# Patient Record
Sex: Female | Born: 1995 | Race: Black or African American | Hispanic: No | Marital: Single | Smoking: Current every day smoker
Health system: Southern US, Community
[De-identification: ages and names within clinical notes are randomized; demographics above are authoritative.]

## PROBLEM LIST (undated history)

## (undated) DIAGNOSIS — D509 Iron deficiency anemia, unspecified: Secondary | ICD-10-CM

---

## 2016-02-23 ENCOUNTER — Encounter: Payer: Self-pay | Admitting: Emergency Medicine

## 2016-02-23 ENCOUNTER — Emergency Department
Admission: EM | Admit: 2016-02-23 | Discharge: 2016-02-23 | Disposition: A | Discharge status: Home / Self Care | Payer: Self-pay | Attending: Emergency Medicine | Admitting: Emergency Medicine

## 2016-02-23 DIAGNOSIS — R55 Syncope and collapse: Secondary | ICD-10-CM | POA: Insufficient documentation

## 2016-02-23 LAB — BASIC METABOLIC PANEL
ANION GAP: 4 — AB (ref 5–15)
BUN: 12 mg/dL (ref 6–20)
CALCIUM: 9.3 mg/dL (ref 8.9–10.3)
CO2: 25 mmol/L (ref 22–32)
Chloride: 108 mmol/L (ref 101–111)
Creatinine, Ser: 0.58 mg/dL (ref 0.44–1.00)
Glucose, Bld: 103 mg/dL — ABNORMAL HIGH (ref 65–99)
Potassium: 4.3 mmol/L (ref 3.5–5.1)
Sodium: 137 mmol/L (ref 135–145)

## 2016-02-23 LAB — URINALYSIS COMPLETE WITH MICROSCOPIC (ARMC ONLY)
Bacteria, UA: NONE SEEN
Bilirubin Urine: NEGATIVE
Glucose, UA: NEGATIVE mg/dL
Hgb urine dipstick: NEGATIVE
Leukocytes, UA: NEGATIVE
NITRITE: NEGATIVE
PH: 5 (ref 5.0–8.0)
PROTEIN: 100 mg/dL — AB
SPECIFIC GRAVITY, URINE: 1.026 (ref 1.005–1.030)

## 2016-02-23 LAB — CBC
HCT: 30.4 % — ABNORMAL LOW (ref 35.0–47.0)
HEMOGLOBIN: 9.3 g/dL — AB (ref 12.0–16.0)
MCH: 19.5 pg — ABNORMAL LOW (ref 26.0–34.0)
MCHC: 30.7 g/dL — ABNORMAL LOW (ref 32.0–36.0)
MCV: 63.5 fL — ABNORMAL LOW (ref 80.0–100.0)
Platelets: 293 10*3/uL (ref 150–440)
RBC: 4.78 MIL/uL (ref 3.80–5.20)
RDW: 21.5 % — ABNORMAL HIGH (ref 11.5–14.5)
WBC: 4.6 10*3/uL (ref 3.6–11.0)

## 2016-02-23 LAB — POCT PREGNANCY, URINE: Preg Test, Ur: NEGATIVE

## 2016-02-23 MED ORDER — FERROUS FUMARATE 325 (106 FE) MG PO TABS: 1.0000 | ORAL_TABLET | Freq: Every day | ORAL | 2 refills | Status: AC

## 2016-02-23 NOTE — ED Provider Notes (Signed)
[facility] Emergency Department Provider Note    ____________________________________________   I have reviewed the triage vital signs and the nursing notes.   HISTORY  Chief Complaint Near Syncope   History limited by: Not Limited   HPI Linda Stokes is a 20 y.o. female who presents to the emergency department today because of concerns for a syncopal episode. Patient states she was at work. She was standing up when she started to feel sick and lightheaded. She then passed out. She denies any injuries from falling down. She denies any chest pain during this episode. No history of CVA in the past. States that she had a fairly normal morning. Patient had been diagnosed with anemia once in the past and had been prescribed iron pills in the past however is currently not taking any. Patient denies any recent fevers or illness.     History reviewed. No pertinent past medical history.  There are no active problems to display for this patient.   History reviewed. No pertinent surgical history.  Prior to Admission medications   Not on File    Allergies Review of patient's allergies indicates no known allergies.  No family history on file.  Social History Social History  Substance Use Topics  . Smoking status: Never Smoker  . Smokeless tobacco: Never Used  . Alcohol use No    Review of Systems  Constitutional: Negative for fever. Cardiovascular: Negative for chest pain. Respiratory: Negative for shortness of breath. Gastrointestinal: Negative for abdominal pain, vomiting and diarrhea. Neurological: Negative for headaches, focal weakness or numbness.   10-point ROS otherwise negative.  ____________________________________________   PHYSICAL EXAM:  VITAL SIGNS: ED Triage Vitals   Enc Vitals Group     BP 105/69     Pulse Rate 81     Resp 16     Temp 97.5 F (36.4 C)     Temp Source Oral     SpO2 100 %     Weight 123 lb (55.8 kg)      Height 5\' 1"  (1.549 m)     Head Circumference      Peak Flow      Pain Score 8   Constitutional: Alert and oriented. Well appearing and in no distress. Eyes: Conjunctivae are normal. PERRL. Normal extraocular movements. ENT   Head: Normocephalic and atraumatic.   Nose: No congestion/rhinnorhea.   Mouth/Throat: Mucous membranes are moist.   Neck: No stridor. Hematological/Lymphatic/Immunilogical: No cervical lymphadenopathy. Cardiovascular: Normal rate, regular rhythm.  No murmurs, rubs, or gallops. Respiratory: Normal respiratory effort without tachypnea nor retractions. Breath sounds are clear and equal bilaterally. No wheezes/rales/rhonchi. Gastrointestinal: Soft and nontender. No distention. There is no CVA tenderness. Genitourinary: Deferred Musculoskeletal: Normal range of motion in all extremities. No joint effusions.  No lower extremity tenderness nor edema. Neurologic:  Normal speech and language. No gross focal neurologic deficits are appreciated.  Skin:  Skin is warm, dry and intact. No rash noted. Psychiatric: Mood and affect are normal. Speech and behavior are normal. Patient exhibits appropriate insight and judgment.  ____________________________________________    LABS (pertinent positives/negatives)  Labs Reviewed  BASIC METABOLIC PANEL - Abnormal; Notable for the following:       Result Value   Glucose, Bld 103 (*)    Anion gap 4 (*)    All other components within normal limits  CBC - Abnormal; Notable for the following:    Hemoglobin 9.3 (*)    HCT 30.4 (*)    MCV 63.5 (*)  MCH 19.5 (*)    MCHC 30.7 (*)    RDW 21.5 (*)    All other components within normal limits  URINALYSIS COMPLETEWITH MICROSCOPIC ([facility] ONLY) - Abnormal; Notable for the following:    Color, Urine YELLOW (*)    APPearance HAZY (*)    Ketones, ur TRACE (*)    Protein, ur 100 (*)    Squamous Epithelial / LPF 6-30 (*)    All other components within normal limits  POC  URINE PREG, ED  POCT PREGNANCY, URINE  CBG MONITORING, ED     ____________________________________________   EKG  I, Phineas SemenGraydon Desaree Downen, attending physician, personally viewed and interpreted this EKG  EKG Time: 1514 Rate: 89 Rhythm: normal sinus rhythm Axis: normal Intervals: qtc 423 QRS: narrow ST changes: no st elevation Impression: normal ekg  ____________________________________________    RADIOLOGY  None  ____________________________________________   PROCEDURES  Procedures  ____________________________________________   INITIAL IMPRESSION / ASSESSMENT AND PLAN / ED COURSE  Pertinent labs & imaging results that were available during my care of the patient were reviewed by me and considered in my medical decision making (see chart for details).  Patient presents to the emergency department today after a episode of syncope at work. Appears safe for some anemia without any concerning findings. Patient is a history of anemia. Will plan on discharging patient home with prescription for iron pills. Patient will follow-up with primary care. ____________________________________________   FINAL CLINICAL IMPRESSION(S) / ED DIAGNOSES  Final diagnoses:  Syncope and collapse     Note: This dictation was prepared with Dragon dictation. Any transcriptional errors that result from this process are unintentional    Phineas SemenGraydon Yer Olivencia, MD 02/23/16 [id number]

## 2016-02-23 NOTE — Discharge Instructions (Signed)
Please seek medical attention for any high fevers, chest pain, shortness of breath, change in behavior, persistent vomiting, bloody stool or any other new or concerning symptoms.  

## 2016-02-23 NOTE — ED Triage Notes (Signed)
While at work patient felt nauseated and had asked line leader if she could go to the bathroom.  Patient describes everything just went black.  Per EMS patient had a near syncopal episode at work and was assisted to floor by Radio broadcast assistantcoworker.  CBG:  105

## 2017-10-30 ENCOUNTER — Encounter: Payer: Self-pay | Admitting: Emergency Medicine

## 2017-10-30 ENCOUNTER — Emergency Department
Admission: EM | Admit: 2017-10-30 | Discharge: 2017-10-30 | Disposition: A | Discharge status: Home / Self Care | Payer: Self-pay | Attending: Emergency Medicine | Admitting: Emergency Medicine

## 2017-10-30 ENCOUNTER — Other Ambulatory Visit: Payer: Self-pay

## 2017-10-30 DIAGNOSIS — R509 Fever, unspecified: Secondary | ICD-10-CM | POA: Insufficient documentation

## 2017-10-30 DIAGNOSIS — J0110 Acute frontal sinusitis, unspecified: Secondary | ICD-10-CM | POA: Insufficient documentation

## 2017-10-30 DIAGNOSIS — R0981 Nasal congestion: Secondary | ICD-10-CM | POA: Insufficient documentation

## 2017-10-30 MED ORDER — FLUTICASONE PROPIONATE 50 MCG/ACT NA SUSP: 1.0000 | Freq: Two times a day (BID) | NASAL | 0 refills | Status: AC

## 2017-10-30 MED ORDER — AMOXICILLIN-POT CLAVULANATE 875-125 MG PO TABS: 1.0000 | ORAL_TABLET | Freq: Two times a day (BID) | ORAL | 0 refills | Status: AC

## 2017-10-30 NOTE — ED Notes (Signed)
See triage note  Presents with sinus pressure and frontal headache  States she had subjective fever yesterday  Afebrile on arrival  States she took a zyrtec yesterday  And sx's eased off

## 2017-10-30 NOTE — ED Provider Notes (Signed)
[facility] Emergency Department Provider Note  ____________________________________________  Time seen: Approximately 3:10 PM  I have reviewed the triage vital signs and the nursing notes.   HISTORY  Chief Complaint Generalized Body Aches and Nasal Congestion    HPI Linda Stokes is a 22 y.o. female who presents emergency department complaining of sinus pressure, frontal headache, nasal congestion, subjective fever.  Patient reports that most of the sinus pressure is in the left frontal region.  Patient reports that she has had increased sinus congestion over the past week but symptoms have worsened over the past 2 days.  Patient took Zyrtec yesterday which did improve symptoms but they returned this morning.  Patient denies any difficulty breathing or swallowing, chest pain, shortness of breath, cough, abdominal pain, nausea or vomiting.  Other than Zyrtec, no medications for this complaint prior to arrival.  No other complaints at this time.  History reviewed. No pertinent past medical history.  There are no active problems to display for this patient.   History reviewed. No pertinent surgical history.  Prior to Admission medications   Medication Sig Start Date End Date Taking? Authorizing Provider  amoxicillin-clavulanate (AUGMENTIN) 875-125 MG tablet Take 1 tablet by mouth 2 (two) times daily. 10/30/17   Cuthriell, Delorise Royals, PA-C  ferrous fumarate (FERRETTS) 325 (106 Fe) MG TABS tablet Take 1 tablet (106 mg of iron total) by mouth daily. 02/23/16   Phineas Semen, MD  fluticasone (FLONASE) 50 MCG/ACT nasal spray Place 1 spray into both nostrils 2 (two) times daily. 10/30/17   Cuthriell, Delorise Royals, PA-C    Allergies Patient has no known allergies.  History reviewed. No pertinent family history.  Social History Social History   Tobacco Use  . Smoking status: Never Smoker  . Smokeless tobacco: Never Used  Substance Use Topics  . Alcohol use: No   . Drug use: No     Review of Systems  Constitutional: Subjective tactile fever/chills Eyes: No visual changes. No discharge ENT: Positive for nasal congestion, sinus pressure Cardiovascular: no chest pain. Respiratory: no cough. No SOB. Gastrointestinal: No abdominal pain.  No nausea, no vomiting.  No diarrhea.  No constipation. Musculoskeletal: Negative for musculoskeletal pain. Skin: Negative for rash, abrasions, lacerations, ecchymosis. Neurological: Positive for frontal headache but denies focal weakness or numbness. 10-point ROS otherwise negative.  ____________________________________________   PHYSICAL EXAM:  VITAL SIGNS: ED Triage Vitals  Enc Vitals Group     BP 10/30/17 1300 111/78     Pulse Rate 10/30/17 1300 99     Resp 10/30/17 1300 18     Temp 10/30/17 1300 98.9 F (37.2 C)     Temp Source 10/30/17 1300 Oral     SpO2 10/30/17 1300 94 %     Weight 10/30/17 1300 126 lb (57.2 kg)     Height 10/30/17 1300  (1.549 m)     Head Circumference --      Peak Flow --      Pain Score 10/30/17 1259 8     Pain Loc --      Pain Edu? --      Excl. in GC? --      Constitutional: Alert and oriented. Well appearing and in no acute distress. Eyes: Conjunctivae are normal. PERRL. EOMI. Head: Atraumatic. ENT:      Ears: EACs and TMs unremarkable bilaterally.      Nose: Mild to moderate clear congestion/rhinnorhea.  She is tender to percussion over the frontal and maxillary sinus, greater  left than right.      Mouth/Throat: Mucous membranes are moist.  Oropharynx is nonerythematous and nonedematous.  Uvula is midline. Neck: No stridor.  Neck is supple full range of motion Hematological/Lymphatic/Immunilogical: No cervical lymphadenopathy. Cardiovascular: Normal rate, regular rhythm. Normal S1 and S2.  Good peripheral circulation. Respiratory: Normal respiratory effort without tachypnea or retractions. Lungs CTAB. Good air entry to the bases with no decreased or absent  breath sounds. Musculoskeletal: Full range of motion to all extremities. No gross deformities appreciated. Neurologic:  Normal speech and language. No gross focal neurologic deficits are appreciated.  Skin:  Skin is warm, dry and intact. No rash noted. Psychiatric: Mood and affect are normal. Speech and behavior are normal. Patient exhibits appropriate insight and judgement.   ____________________________________________   LABS (all labs ordered are listed, but only abnormal results are displayed)  Labs Reviewed - No data to display ____________________________________________  EKG   ____________________________________________  RADIOLOGY   No results found.  ____________________________________________    PROCEDURES  Procedure(s) performed:    Procedures    Medications - No data to display   ____________________________________________   INITIAL IMPRESSION / ASSESSMENT AND PLAN / ED COURSE  Pertinent labs & imaging results that were available during my care of the patient were reviewed by me and considered in my medical decision making (see chart for details).  Review of the  [place] was performed in accordance of the [facility] prior to dispensing any controlled drugs.     Patient's diagnosis is consistent with sinusitis.  Patient presents with worsening sinusitis with sinus pressure and headache.  Exam is consistent with sinusitis.  Differential included viral URI, allergic rhinitis, sinusitis.. Patient will be discharged home with prescriptions for Flonase, Augmentin.  Patient is to use Zyrtec at home.. Patient is to follow up with primary care as needed or otherwise directed. Patient is given ED precautions to return to the ED for any worsening or new symptoms.     ____________________________________________  FINAL CLINICAL IMPRESSION(S) / ED DIAGNOSES  Final diagnoses:  Acute non-recurrent frontal sinusitis      NEW MEDICATIONS STARTED DURING THIS  VISIT:  ED Discharge Orders        Ordered    amoxicillin-clavulanate (AUGMENTIN) 875-125 MG tablet  2 times daily     10/30/17 1517    fluticasone (FLONASE) 50 MCG/ACT nasal spray  2 times daily     10/30/17 1517          This chart was dictated using voice recognition software/Dragon. Despite best efforts to proofread, errors can occur which can change the meaning. Any change was purely unintentional.    Racheal Patches, PA-C 10/30/17 1517    Jeanmarie Plant, MD 10/30/17 2252

## 2017-10-30 NOTE — ED Triage Notes (Signed)
Pt reports subjective fever, body aches and pain with swallowing, worse  Yesterday , but better today. Pt states she took Zyrtec yesterday.  Pt c/o nasal congestion.

## 2018-05-29 ENCOUNTER — Encounter: Payer: Self-pay | Admitting: Emergency Medicine

## 2018-05-29 ENCOUNTER — Emergency Department
Admission: EM | Admit: 2018-05-29 | Discharge: 2018-05-29 | Disposition: A | Discharge status: Home / Self Care | Payer: Self-pay | Attending: Emergency Medicine | Admitting: Emergency Medicine

## 2018-05-29 DIAGNOSIS — K529 Noninfective gastroenteritis and colitis, unspecified: Secondary | ICD-10-CM | POA: Insufficient documentation

## 2018-05-29 DIAGNOSIS — Z79899 Other long term (current) drug therapy: Secondary | ICD-10-CM | POA: Insufficient documentation

## 2018-05-29 LAB — URINALYSIS, COMPLETE (UACMP) WITH MICROSCOPIC
Bacteria, UA: NONE SEEN
Bilirubin Urine: NEGATIVE
Glucose, UA: NEGATIVE mg/dL
Ketones, ur: NEGATIVE mg/dL
Leukocytes, UA: NEGATIVE
Nitrite: NEGATIVE
Protein, ur: 30 mg/dL — AB
Specific Gravity, Urine: 1.026 (ref 1.005–1.030)
pH: 5 (ref 5.0–8.0)

## 2018-05-29 LAB — COMPREHENSIVE METABOLIC PANEL WITH GFR
ALT: 8 U/L (ref 0–44)
AST: 14 U/L — ABNORMAL LOW (ref 15–41)
Albumin: 3.8 g/dL (ref 3.5–5.0)
Alkaline Phosphatase: 50 U/L (ref 38–126)
Anion gap: 10 (ref 5–15)
BUN: 9 mg/dL (ref 6–20)
CO2: 24 mmol/L (ref 22–32)
Calcium: 9.3 mg/dL (ref 8.9–10.3)
Chloride: 104 mmol/L (ref 98–111)
Creatinine, Ser: 0.64 mg/dL (ref 0.44–1.00)
GFR calc Af Amer: >60 mL/min
GFR calc non Af Amer: >60 mL/min
Glucose, Bld: 114 mg/dL — ABNORMAL HIGH (ref 70–99)
Potassium: 2.9 mmol/L — ABNORMAL LOW (ref 3.5–5.1)
Sodium: 138 mmol/L (ref 135–145)
Total Bilirubin: 0.4 mg/dL (ref 0.3–1.2)
Total Protein: 7.2 g/dL (ref 6.5–8.1)

## 2018-05-29 LAB — CBC
HCT: 31 % — ABNORMAL LOW (ref 36.0–46.0)
HEMOGLOBIN: 8.8 g/dL — AB (ref 12.0–15.0)
MCH: 18.5 pg — AB (ref 26.0–34.0)
MCHC: 28.4 g/dL — AB (ref 30.0–36.0)
MCV: 65.3 fL — AB (ref 80.0–100.0)
Platelets: 460 10*3/uL — ABNORMAL HIGH (ref 150–400)
RBC: 4.75 MIL/uL (ref 3.87–5.11)
RDW: 22.7 % — ABNORMAL HIGH (ref 11.5–15.5)
WBC: 4.4 10*3/uL (ref 4.0–10.5)
nRBC: 0 % (ref 0.0–0.2)

## 2018-05-29 LAB — POCT PREGNANCY, URINE: PREG TEST UR: NEGATIVE

## 2018-05-29 LAB — LIPASE, BLOOD: Lipase: 29 U/L (ref 11–51)

## 2018-05-29 MED ORDER — ONDANSETRON 4 MG PO TBDP: 4.0000 mg | ORAL_TABLET | Freq: Three times a day (TID) | ORAL | 0 refills | Status: DC | PRN

## 2018-05-29 MED ORDER — ONDANSETRON 4 MG PO TBDP
4.0000 mg | ORAL_TABLET | Freq: Once | ORAL | Status: AC
  Administered 2018-05-29: 4 mg via ORAL
  Filled 2018-05-29: qty 1

## 2018-05-29 NOTE — ED Provider Notes (Signed)
[facility] Emergency Department Provider Note   ____________________________________________    I have reviewed the triage vital signs and the nursing notes.   HISTORY  Chief Complaint Abdominal Pain     HPI Linda Stokes is a 22 y.o. female who presents with complaints of abdominal cramping, nausea vomiting and diarrhea over the last several days.  Patient reports she has been on her menstrual cycle and has been taking ibuprofen for pelvic cramps which is typical for her.  She also reports that she then developed upper and lower abdominal cramping with nausea vomiting and diarrhea.  She has tried Pepto-Bismol with little improvement.  However today she does report that she feels somewhat better.  She is concerned because she was taking ibuprofen for the pain and she worries this may have injured her stomach lining.   History reviewed. No pertinent past medical history.  There are no active problems to display for this patient.   History reviewed. No pertinent surgical history.  Prior to Admission medications   Medication Sig Start Date End Date Taking? Authorizing Provider  amoxicillin-clavulanate (AUGMENTIN) 875-125 MG tablet Take 1 tablet by mouth 2 (two) times daily. 10/30/17   Cuthriell, Delorise Royals, PA-C  ferrous fumarate (FERRETTS) 325 (106 Fe) MG TABS tablet Take 1 tablet (106 mg of iron total) by mouth daily. 02/23/16   Phineas Semen, MD  fluticasone (FLONASE) 50 MCG/ACT nasal spray Place 1 spray into both nostrils 2 (two) times daily. 10/30/17   Cuthriell, Delorise Royals, PA-C  ondansetron (ZOFRAN ODT) 4 MG disintegrating tablet Take 1 tablet (4 mg total) by mouth every 8 (eight) hours as needed for nausea or vomiting. 05/29/18   Jene Every, MD     Allergies Patient has no known allergies.  No family history on file.  Social History Social History   Tobacco Use  . Smoking status: Never Smoker  . Smokeless tobacco: Never Used    Substance Use Topics  . Alcohol use: No  . Drug use: No    Review of Systems  Constitutional: No fever/chills Eyes: No visual changes.  ENT: No sore throat. Cardiovascular: Denies chest pain. Respiratory: Denies shortness of breath. Gastrointestinal: As above Genitourinary: Negative for dysuria. Musculoskeletal: Negative for back pain. Skin: Negative for rash. Neurological: Negative for headaches   ____________________________________________   PHYSICAL EXAM:  VITAL SIGNS: ED Triage Vitals [05/29/18 1802]  Enc Vitals Group     BP 109/87     Pulse Rate 89     Resp 18     Temp 99.2 F (37.3 C)     Temp Source Oral     SpO2 100 %     Weight 57.2 kg (126 lb)     Height 1.549 m (5\' 1" )     Head Circumference      Peak Flow      Pain Score 7     Pain Loc      Pain Edu?      Excl. in GC?     Constitutional: Alert and oriented.  Eyes: Conjunctivae are normal.   Nose: No congestion/rhinnorhea. Mouth/Throat: Mucous membranes are moist.    Cardiovascular: Normal rate, regular rhythm. Grossly normal heart sounds.  Good peripheral circulation. Respiratory: Normal respiratory effort.  No retractions. Lungs CTAB. Gastrointestinal: Soft and nontender. No distention.  .  Musculoskeletal:  Warm and well perfused Neurologic:  Normal speech and language. No gross focal neurologic deficits are appreciated.  Skin:  Skin is warm, dry and  intact. No rash noted. Psychiatric: Mood and affect are normal. Speech and behavior are normal.  ____________________________________________   LABS (all labs ordered are listed, but only abnormal results are displayed)  Labs Reviewed  COMPREHENSIVE METABOLIC PANEL - Abnormal; Notable for the following components:      Result Value   Potassium 2.9 (*)    Glucose, Bld 114 (*)    AST 14 (*)    All other components within normal limits  CBC - Abnormal; Notable for the following components:   Hemoglobin 8.8 (*)    HCT 31.0 (*)    MCV  65.3 (*)    MCH 18.5 (*)    MCHC 28.4 (*)    RDW 22.7 (*)    Platelets 460 (*)    All other components within normal limits  URINALYSIS, COMPLETE (UACMP) WITH MICROSCOPIC - Abnormal; Notable for the following components:   Color, Urine AMBER (*)    APPearance CLOUDY (*)    Hgb urine dipstick MODERATE (*)    Protein, ur 30 (*)    All other components within normal limits  LIPASE, BLOOD  POCT PREGNANCY, URINE  POC URINE PREG, ED   ____________________________________________  EKG  None ____________________________________________  RADIOLOGY   ____________________________________________   PROCEDURES  Procedure(s) performed: No  Procedures   Critical Care performed: No ____________________________________________   INITIAL IMPRESSION / ASSESSMENT AND PLAN / ED COURSE  Pertinent labs & imaging results that were available during my care of the patient were reviewed by me and considered in my medical decision making (see chart for details).  Patient presents with complaints of nausea vomiting and diarrhea, doubt overdose symptoms more consistent with viral gastroenteritis.  Abdominal exam is quite reassuring.  Hemoglobin is stable albeit low, suggested iron supplementation outpatient follow-up for further evaluation of her anemia which is chronic.  Lab work otherwise unremarkable, treated with p.o. Zofran with significant provement nausea tolerating p.o.'s, appropriate for outpatient follow-up    ____________________________________________   FINAL CLINICAL IMPRESSION(S) / ED DIAGNOSES  Final diagnoses:  Gastroenteritis        Note:  This document was prepared using Dragon voice recognition software and may include unintentional dictation errors.    Jene EveryKinner, Clary Boulais, MD 05/29/18 2207

## 2018-05-29 NOTE — ED Notes (Signed)
Pt states she took between 18-20 ibuprofen for her menstrual cycle since Wednesday. Pt states she believes that is why she is feeling the way she is.

## 2018-05-29 NOTE — ED Triage Notes (Signed)
Patient states between Wednesday and Saturday I probably took 18-20 ibuprofens.  I guess I irritated my stomach lining or something.  I haven't been able to eat.  My feet are freezing cold and the rest of my body is burning up."  Patient reports diarrhea and vomiting and "black stuff on my tongue."  Patient states diarrhea is black.  Patient is complaining of upper abdominal pain.

## 2019-03-05 ENCOUNTER — Other Ambulatory Visit: Payer: Self-pay

## 2019-03-05 ENCOUNTER — Emergency Department
Admission: EM | Admit: 2019-03-05 | Discharge: 2019-03-05 | Disposition: A | Discharge status: Home / Self Care | Payer: Self-pay | Attending: Emergency Medicine | Admitting: Emergency Medicine

## 2019-03-05 ENCOUNTER — Encounter: Payer: Self-pay | Admitting: Emergency Medicine

## 2019-03-05 DIAGNOSIS — E860 Dehydration: Secondary | ICD-10-CM | POA: Insufficient documentation

## 2019-03-05 DIAGNOSIS — F17290 Nicotine dependence, other tobacco product, uncomplicated: Secondary | ICD-10-CM | POA: Insufficient documentation

## 2019-03-05 DIAGNOSIS — M25562 Pain in left knee: Secondary | ICD-10-CM | POA: Insufficient documentation

## 2019-03-05 DIAGNOSIS — M25521 Pain in right elbow: Secondary | ICD-10-CM | POA: Insufficient documentation

## 2019-03-05 DIAGNOSIS — R55 Syncope and collapse: Secondary | ICD-10-CM

## 2019-03-05 DIAGNOSIS — Z79899 Other long term (current) drug therapy: Secondary | ICD-10-CM | POA: Insufficient documentation

## 2019-03-05 HISTORY — DX: Iron deficiency anemia, unspecified: D50.9

## 2019-03-05 LAB — BASIC METABOLIC PANEL
Anion gap: 6 (ref 5–15)
BUN: 12 mg/dL (ref 6–20)
CO2: 25 mmol/L (ref 22–32)
Calcium: 8.6 mg/dL — ABNORMAL LOW (ref 8.9–10.3)
Chloride: 107 mmol/L (ref 98–111)
Creatinine, Ser: 0.87 mg/dL (ref 0.44–1.00)
GFR calc Af Amer: >60 mL/min (ref >60)
GFR calc non Af Amer: >60 mL/min (ref >60)
Glucose, Bld: 92 mg/dL (ref 70–99)
Potassium: 4.2 mmol/L (ref 3.5–5.1)
Sodium: 138 mmol/L (ref 135–145)

## 2019-03-05 LAB — CBC
HCT: 43.4 % (ref 36.0–46.0)
Hemoglobin: 13 g/dL (ref 12.0–15.0)
MCH: 22.5 pg — ABNORMAL LOW (ref 26.0–34.0)
MCHC: 30 g/dL (ref 30.0–36.0)
MCV: 75.2 fL — ABNORMAL LOW (ref 80.0–100.0)
Platelets: 105 10*3/uL — ABNORMAL LOW (ref 150–400)
RBC: 5.77 MIL/uL — ABNORMAL HIGH (ref 3.87–5.11)
RDW: 21.3 % — ABNORMAL HIGH (ref 11.5–15.5)
WBC: 5.4 10*3/uL (ref 4.0–10.5)
nRBC: 0 % (ref 0.0–0.2)

## 2019-03-05 MED ORDER — SODIUM CHLORIDE 0.9 % IV BOLUS
1000.0000 mL | Freq: Once | INTRAVENOUS | Status: AC
  Administered 2019-03-05: 1000 mL via INTRAVENOUS

## 2019-03-05 MED ORDER — SODIUM CHLORIDE 0.9% FLUSH: 3.0000 mL | Freq: Once | INTRAVENOUS | Status: DC

## 2019-03-05 NOTE — ED Notes (Signed)
Pt ambulated around room with report of no weakness or dizziness. MD made aware.

## 2019-03-05 NOTE — ED Notes (Signed)
Pt resting in bed playing on phone, pt's sister remains at bedside. Pt c/o slight headache. Will continue to monitor for further patient needs.

## 2019-03-05 NOTE — ED Triage Notes (Addendum)
Pt presents to ED via [facility] with c/o near syncope. Per EMS pt has hx of iron deficiency anemia, gave plasma at approx 10am today. Per EMS pt was shopping when she "blacked out but did not lose consciousness". Pt reports seeing black spots. EMS reports initial BP 79/46, after 250 cc's NS bolus pt BP 87 systolic.  Pt with minimal abrasion to forehead, no bleeding noted at this time.

## 2019-03-05 NOTE — Discharge Instructions (Addendum)
Please seek medical attention for any high fevers, chest pain, shortness of breath, change in behavior, persistent vomiting, bloody stool or any other new or concerning symptoms.  

## 2019-03-05 NOTE — ED Provider Notes (Addendum)
[facility] Emergency Department Provider Note  ____________________________________________   I have reviewed the triage vital signs and the nursing notes.   HISTORY  Chief Complaint Syncope  History limited by: Not Limited   HPI Linda Stokes is a 23 y.o. female who presents to the emergency department today because of concerns for syncopal episodes.  Patient states that she donated plasma earlier today.  The patient had only eaten a sandwich.  She states she did not drink a lot.  About 30 minutes after donating the plasma she was walking around when she started feeling hot and flushed.  She did then see black and fall to the ground.  She had this happened 2 more times.  Did suffer a small abrasion to her forehead.  Complaining of mild left knee and right elbow pain.  States that she passed out once before a few years ago.  Records reviewed. Per medical record review patient has a history of anemia.   Past Medical History:  Diagnosis Date  . Iron deficiency anemia     There are no active problems to display for this patient.   History reviewed. No pertinent surgical history.  Prior to Admission medications   Medication Sig Start Date End Date Taking? Authorizing Provider  amoxicillin-clavulanate (AUGMENTIN) 875-125 MG tablet Take 1 tablet by mouth 2 (two) times daily. 10/30/17   Cuthriell, Delorise RoyalsJonathan D, PA-C  ferrous fumarate (FERRETTS) 325 (106 Fe) MG TABS tablet Take 1 tablet (106 mg of iron total) by mouth daily. 02/23/16   Phineas SemenGoodman, Avo Schlachter, MD  fluticasone (FLONASE) 50 MCG/ACT nasal spray Place 1 spray into both nostrils 2 (two) times daily. 10/30/17   Cuthriell, Delorise RoyalsJonathan D, PA-C  ondansetron (ZOFRAN ODT) 4 MG disintegrating tablet Take 1 tablet (4 mg total) by mouth every 8 (eight) hours as needed for nausea or vomiting. 05/29/18   Jene EveryKinner, Robert, MD    Allergies Patient has no known allergies.  History reviewed. No pertinent family history.  Social  History Social History   Tobacco Use  . Smoking status: Current Every Day Smoker    Types: Cigars  . Smokeless tobacco: Never Used  . Tobacco comment: Every other day  Substance Use Topics  . Alcohol use: No  . Drug use: No    Review of Systems Constitutional: No fever/chills Eyes: No visual changes. ENT: No sore throat. Cardiovascular: Denies chest pain. Respiratory: Denies shortness of breath. Gastrointestinal: No abdominal pain.  No nausea, no vomiting.  No diarrhea.   Genitourinary: Negative for dysuria. Musculoskeletal: Negative for back pain. Skin: Negative for rash. Neurological: Positive for syncopal episodes.  ____________________________________________   PHYSICAL EXAM:  VITAL SIGNS: ED Triage Vitals  Enc Vitals Group     BP 03/05/19 1630 90/61     Pulse Rate 03/05/19 1630 63     Resp 03/05/19 1630 18     Temp 03/05/19 1630 98.6 F (37 C)     Temp Source 03/05/19 1630 Oral     SpO2 03/05/19 1630 98 %     Weight 03/05/19 1627 116 lb (52.6 kg)     Height 03/05/19 1627 5\' 1"  (1.549 m)     Head Circumference --      Peak Flow --      Pain Score 03/05/19 1627 0   Constitutional: Alert and oriented.  Eyes: Conjunctivae are normal.  ENT      Head: Normocephalic and atraumatic.      Nose: No congestion/rhinnorhea.      Mouth/Throat:  Mucous membranes are moist.      Neck: No stridor. Hematological/Lymphatic/Immunilogical: No cervical lymphadenopathy. Cardiovascular: Normal rate, regular rhythm.  No murmurs, rubs, or gallops.  Respiratory: Normal respiratory effort without tachypnea nor retractions. Breath sounds are clear and equal bilaterally. No wheezes/rales/rhonchi. Gastrointestinal: Soft and non tender. No rebound. No guarding.  Genitourinary: Deferred Musculoskeletal: Normal range of motion in all extremities. No lower extremity edema. Neurologic:  Normal speech and language. No gross focal neurologic deficits are appreciated.  Skin:  Skin is warm,  dry and intact. No rash noted. Psychiatric: Mood and affect are normal. Speech and behavior are normal. Patient exhibits appropriate insight and judgment.  ____________________________________________    LABS (pertinent positives/negatives)  BMP wnl except ca 8.6 CBC wbc 5.4, hgb 13.0, plt 105  ____________________________________________   EKG  I, Nance Pear, attending physician, personally viewed and interpreted this EKG  EKG Time: 1634 Rate: 67 Rhythm: sinus rhythm Axis: normal Intervals: qtc 405 QRS: narrow ST changes: no st elevation Impression: normal ekg   ____________________________________________    RADIOLOGY  None  ____________________________________________   PROCEDURES  Procedures  ____________________________________________   INITIAL IMPRESSION / ASSESSMENT AND PLAN / ED COURSE  Pertinent labs & imaging results that were available during my care of the patient were reviewed by me and considered in my medical decision making (see chart for details).   Patient presented to the emergency department today with concerns for syncopal episodes after donating plasma.  It does sound like the patient had not eaten or drank a lot prior to donating plasma.  Patient was given IV fluids here in the emergency department.  She then got up and felt better.  Will plan on discharging. Blood work did not show anemia. Did discuss with patient low platelet count. Did recommend patient get level rechecked in roughly 1 week.   ____________________________________________   FINAL CLINICAL IMPRESSION(S) / ED DIAGNOSES  Final diagnoses:  Syncope and collapse  Dehydration     Note: This dictation was prepared with Dragon dictation. Any transcriptional errors that result from this process are unintentional     Nance Pear, MD 03/05/19 3151    Nance Pear, MD 03/05/19 [phone]

## 2019-04-12 ENCOUNTER — Emergency Department: Payer: Self-pay

## 2019-04-12 ENCOUNTER — Other Ambulatory Visit: Payer: Self-pay

## 2019-04-12 ENCOUNTER — Encounter: Payer: Self-pay | Admitting: Emergency Medicine

## 2019-04-12 ENCOUNTER — Emergency Department
Admission: EM | Admit: 2019-04-12 | Discharge: 2019-04-12 | Disposition: A | Discharge status: Home / Self Care | Payer: Self-pay | Attending: Emergency Medicine | Admitting: Emergency Medicine

## 2019-04-12 DIAGNOSIS — R1013 Epigastric pain: Secondary | ICD-10-CM

## 2019-04-12 DIAGNOSIS — Z79899 Other long term (current) drug therapy: Secondary | ICD-10-CM | POA: Insufficient documentation

## 2019-04-12 DIAGNOSIS — K2900 Acute gastritis without bleeding: Secondary | ICD-10-CM | POA: Insufficient documentation

## 2019-04-12 DIAGNOSIS — F17290 Nicotine dependence, other tobacco product, uncomplicated: Secondary | ICD-10-CM | POA: Insufficient documentation

## 2019-04-12 LAB — COMPREHENSIVE METABOLIC PANEL
ALT: 10 U/L (ref 0–44)
AST: 17 U/L (ref 15–41)
Albumin: 3.9 g/dL (ref 3.5–5.0)
Alkaline Phosphatase: 40 U/L (ref 38–126)
Anion gap: 14 (ref 5–15)
BUN: 8 mg/dL (ref 6–20)
CO2: 21 mmol/L — ABNORMAL LOW (ref 22–32)
Calcium: 9 mg/dL (ref 8.9–10.3)
Chloride: 107 mmol/L (ref 98–111)
Creatinine, Ser: 0.88 mg/dL (ref 0.44–1.00)
GFR calc Af Amer: >60 mL/min (ref >60)
GFR calc non Af Amer: >60 mL/min (ref >60)
Glucose, Bld: 90 mg/dL (ref 70–99)
Potassium: 4.1 mmol/L (ref 3.5–5.1)
Sodium: 142 mmol/L (ref 135–145)
Total Bilirubin: 0.7 mg/dL (ref 0.3–1.2)
Total Protein: 6.3 g/dL — ABNORMAL LOW (ref 6.5–8.1)

## 2019-04-12 LAB — URINALYSIS, COMPLETE (UACMP) WITH MICROSCOPIC
Bacteria, UA: NONE SEEN
Bilirubin Urine: NEGATIVE
Glucose, UA: 50 mg/dL — AB
Ketones, ur: 20 mg/dL — AB
Leukocytes,Ua: NEGATIVE
Nitrite: NEGATIVE
Protein, ur: 100 mg/dL — AB
RBC / HPF: >50 RBC/hpf — ABNORMAL HIGH (ref 0–5)
Specific Gravity, Urine: 1.013 (ref 1.005–1.030)
Squamous Epithelial / HPF: NONE SEEN (ref 0–5)
pH: 5 (ref 5.0–8.0)

## 2019-04-12 LAB — CBC
HCT: 38.2 % (ref 36.0–46.0)
Hemoglobin: 12 g/dL (ref 12.0–15.0)
MCH: 23.8 pg — ABNORMAL LOW (ref 26.0–34.0)
MCHC: 31.4 g/dL (ref 30.0–36.0)
MCV: 75.6 fL — ABNORMAL LOW (ref 80.0–100.0)
Platelets: 260 10*3/uL (ref 150–400)
RBC: 5.05 MIL/uL (ref 3.87–5.11)
RDW: 17.1 % — ABNORMAL HIGH (ref 11.5–15.5)
WBC: 4.9 10*3/uL (ref 4.0–10.5)
nRBC: 0 % (ref 0.0–0.2)

## 2019-04-12 LAB — POCT PREGNANCY, URINE: Preg Test, Ur: NEGATIVE

## 2019-04-12 LAB — LIPASE, BLOOD: Lipase: 19 U/L (ref 11–51)

## 2019-04-12 MED ORDER — ONDANSETRON 4 MG PO TBDP: 4.0000 mg | ORAL_TABLET | Freq: Three times a day (TID) | ORAL | 0 refills | Status: AC | PRN

## 2019-04-12 MED ORDER — SODIUM CHLORIDE 0.9 % IV BOLUS
500.0000 mL | Freq: Once | INTRAVENOUS | Status: AC
  Administered 2019-04-12: 14:00:00 500 mL via INTRAVENOUS

## 2019-04-12 MED ORDER — ONDANSETRON HCL 4 MG/2ML IJ SOLN
4.0000 mg | Freq: Once | INTRAMUSCULAR | Status: AC
  Administered 2019-04-12: 14:00:00 4 mg via INTRAVENOUS
  Filled 2019-04-12: qty 2

## 2019-04-12 MED ORDER — FAMOTIDINE IN NACL 20-0.9 MG/50ML-% IV SOLN
20.0000 mg | Freq: Once | INTRAVENOUS | Status: AC
  Administered 2019-04-12: 20 mg via INTRAVENOUS
  Filled 2019-04-12 (×2): qty 50

## 2019-04-12 MED ORDER — LIDOCAINE VISCOUS HCL 2 % MT SOLN
15.0000 mL | Freq: Once | OROMUCOSAL | Status: AC
  Administered 2019-04-12: 15:00:00 15 mL via ORAL
  Filled 2019-04-12: qty 15

## 2019-04-12 MED ORDER — OMEPRAZOLE 40 MG PO CPDR: 40.0000 mg | DELAYED_RELEASE_CAPSULE | Freq: Every day | ORAL | 1 refills | Status: AC

## 2019-04-12 MED ORDER — ALUM & MAG HYDROXIDE-SIMETH 200-200-20 MG/5ML PO SUSP
30.0000 mL | Freq: Once | ORAL | Status: AC
  Administered 2019-04-12: 15:00:00 30 mL via ORAL
  Filled 2019-04-12: qty 30

## 2019-04-12 MED ORDER — SODIUM CHLORIDE 0.9% FLUSH
3.0000 mL | Freq: Once | INTRAVENOUS | Status: AC
  Administered 2019-04-12: 14:00:00 3 mL via INTRAVENOUS

## 2019-04-12 NOTE — ED Provider Notes (Signed)
[facility] Emergency Department Provider Note  ____________________________________________  Time seen: Approximately 2:48 PM  I have reviewed the triage vital signs and the nursing notes.   HISTORY  Chief Complaint Abdominal Pain   HPI Linda Stokes is a 23 y.o. female the history of iron deficiency anemia and iron pills who presents for evaluation  of abdominal pain.  Patient reports that the pain started yesterday while she was at work.  She reports taking 4 ibuprofen on an empty stomach for menstrual cramps and started having burning sensation in the left upper quadrant associated with nausea.  She reports that she started vomiting and has been unable to keep anything down since then.  She is complaining of constant burning left upper quadrant abdominal pain.  She reports that she usually takes a lot of ibuprofen during her menses which started this time 3 days ago.  She denies any coffee-ground emesis or hematemesis, no melena, no diarrhea, no fever, no chills, no dysuria or hematuria.  Patient does report mild constipation which she has had for the last month since being placed on iron for iron deficiency anemia.  Still having bowel movements and passing flatus.  No prior abdominal surgeries.  Past Medical History:  Diagnosis Date  . Iron deficiency anemia     Prior to Admission medications   Medication Sig Start Date End Date Taking? Authorizing Provider  amoxicillin-clavulanate (AUGMENTIN) 875-125 MG tablet Take 1 tablet by mouth 2 (two) times daily. 10/30/17   Cuthriell, Charline Bills, PA-C  ferrous fumarate (FERRETTS) 325 (106 Fe) MG TABS tablet Take 1 tablet (106 mg of iron total) by mouth daily. 02/23/16   Nance Pear, MD  fluticasone (FLONASE) 50 MCG/ACT nasal spray Place 1 spray into both nostrils 2 (two) times daily. 10/30/17   Cuthriell, Charline Bills, PA-C  omeprazole (PRILOSEC) 40 MG capsule Take 1 capsule (40 mg total) by mouth daily. 04/12/19  04/11/20  Rudene Re, MD  ondansetron (ZOFRAN ODT) 4 MG disintegrating tablet Take 1 tablet (4 mg total) by mouth every 8 (eight) hours as needed. 04/12/19   Rudene Re, MD    Allergies Patient has no known allergies.  No family history on file.  Social History Social History   Tobacco Use  . Smoking status: Current Every Day Smoker    Types: Cigars  . Smokeless tobacco: Never Used  . Tobacco comment: Every other day  Substance Use Topics  . Alcohol use: No  . Drug use: No    Review of Systems  Constitutional: Negative for fever. Eyes: Negative for visual changes. ENT: Negative for sore throat. Neck: No neck pain  Cardiovascular: Negative for chest pain. Respiratory: Negative for shortness of breath. Gastrointestinal: + abdominal pain, nausea, and vomiting. No diarrhea. Genitourinary: Negative for dysuria. Musculoskeletal: Negative for back pain. Skin: Negative for rash. Neurological: Negative for headaches, weakness or numbness. Psych: No SI or HI  ____________________________________________   PHYSICAL EXAM:  VITAL SIGNS: ED Triage Vitals  Enc Vitals Group     BP 04/12/19 1202 104/74     Pulse Rate 04/12/19 1159 79     Resp 04/12/19 1159 16     Temp 04/12/19 1159 98.7 F (37.1 C)     Temp Source 04/12/19 1159 Oral     SpO2 04/12/19 1159 99 %     Weight 04/12/19 1202 116 lb (52.6 kg)     Height 04/12/19 1202 5\' 1"  (1.549 m)     Head Circumference --  Peak Flow --      Pain Score 04/12/19 1202 3     Pain Loc --      Pain Edu? --      Excl. in GC? --     Constitutional: Alert and oriented. Well appearing and in no apparent distress. HEENT:      Head: Normocephalic and atraumatic.         Eyes: Conjunctivae are normal. Sclera is non-icteric.       Mouth/Throat: Mucous membranes are moist.       Neck: Supple with no signs of meningismus. Cardiovascular: Regular rate and rhythm. No murmurs, gallops, or rubs. 2+ symmetrical distal  pulses are present in all extremities. No JVD. Respiratory: Normal respiratory effort. Lungs are clear to auscultation bilaterally. No wheezes, crackles, or rhonchi.  Gastrointestinal: Soft, tender to palpation in the right upper quadrant, epigastric, left upper quadrant, and non distended with positive bowel sounds. No rebound or guarding. Genitourinary: No CVA tenderness. Musculoskeletal: Nontender with normal range of motion in all extremities. No edema, cyanosis, or erythema of extremities. Neurologic: Normal speech and language. Face is symmetric. Moving all extremities. No gross focal neurologic deficits are appreciated. Skin: Skin is warm, dry and intact. No rash noted. Psychiatric: Mood and affect are normal. Speech and behavior are normal.  ____________________________________________   LABS (all labs ordered are listed, but only abnormal results are displayed)  Labs Reviewed  COMPREHENSIVE METABOLIC PANEL - Abnormal; Notable for the following components:      Result Value   CO2 21 (*)    Total Protein 6.3 (*)    All other components within normal limits  CBC - Abnormal; Notable for the following components:   MCV 75.6 (*)    MCH 23.8 (*)    RDW 17.1 (*)    All other components within normal limits  URINALYSIS, COMPLETE (UACMP) WITH MICROSCOPIC - Abnormal; Notable for the following components:   Color, Urine YELLOW (*)    APPearance HAZY (*)    Glucose, UA 50 (*)    Hgb urine dipstick LARGE (*)    Ketones, ur 20 (*)    Protein, ur 100 (*)    RBC / HPF >50 (*)    All other components within normal limits  LIPASE, BLOOD  POC URINE PREG, ED  POCT PREGNANCY, URINE   ____________________________________________  EKG  none  ____________________________________________  RADIOLOGY  I have personally reviewed the images performed during this visit and I agree with the Radiologist's read.   Interpretation by Radiologist:  Dg Abdomen 1 View  Result Date: 04/12/2019  CLINICAL DATA:  Constipation, abdominal pain EXAM: ABDOMEN - 1 VIEW COMPARISON:  None. FINDINGS: The bowel gas pattern is normal. No radio-opaque calculi or other significant radiographic abnormality are seen. IMPRESSION: Negative. Electronically Signed   By: Duanne GuessNicholas  Plundo M.D.   On: 04/12/2019 14:30   [place] Abdomen Limited Ruq  Result Date: 04/12/2019 CLINICAL DATA:  Upper abdominal pain EXAM: ULTRASOUND ABDOMEN LIMITED RIGHT UPPER QUADRANT COMPARISON:  None. FINDINGS: Gallbladder: No gallstones or wall thickening visualized. There is no pericholecystic fluid. No sonographic Murphy sign noted by sonographer. Common bile duct: Diameter: 2 mm. No intrahepatic or extrahepatic biliary duct dilatation. Liver: No focal lesion identified. Within normal limits in parenchymal echogenicity. Portal vein is patent on color Doppler imaging with normal direction of blood flow towards the liver. Other: None. IMPRESSION: Study within normal limits. Electronically Signed   By: Bretta BangWilliam  Woodruff III M.D.   On: 04/12/2019 14:45  ____________________________________________   PROCEDURES  Procedure(s) performed: None Procedures Critical Care performed:  None ____________________________________________   INITIAL IMPRESSION / ASSESSMENT AND PLAN / ED COURSE   23 y.o. female the history of iron deficiency anemia and iron pills who presents for evaluation  of LUQ burning abdominal pain, nausea, and vomiting since yesterday.  Symptoms started after patient took 4 x 200 mg ibuprofen on an empty stomach for menstrual cramps.  She is well-appearing in no distress, abdomen is soft with no rebound or guarding, she does have upper quadrants tenderness on palpation.  KUB showing no evidence of SBO or significant stool burden.  Right upper quadrant ultrasound negative for gallstones.  CBC, CMP, lipase all within normal limits.  UA negative for UTI.  Pregnancy test is negative.  Presentation concerning for GERD versus  gastritis versus peptic ulcer disease.  Discussed the importance of staying away from NSAIDs or at least taking them with a meal to prevent recurrent symptoms. Patient given IV pepcid and GI cocktail. Will dc on omeprazole and zofran.  Will refer to GI.  Discussed my standard return precautions for worsening abdominal pain, hematemesis, melena, fever.  Otherwise patient will follow up with GI.       As part of my medical decision making, I reviewed the following data within the electronic MEDICAL RECORD NUMBER Nursing notes reviewed and incorporated, Labs reviewed , Old chart reviewed, Radiograph reviewed , Notes from prior ED visits and [place] Controlled Substance Database   Patient was evaluated in Emergency Department today for the symptoms described in the history of present illness. Patient was evaluated in the context of the global COVID-19 pandemic, which necessitated consideration that the patient might be at risk for infection with the SARS-CoV-2 virus that causes COVID-19. Institutional protocols and algorithms that pertain to the evaluation of patients at risk for COVID-19 are in a state of rapid change based on information released by regulatory bodies including the CDC and federal and state organizations. These policies and algorithms were followed during the patient's care in the ED.   ____________________________________________   FINAL CLINICAL IMPRESSION(S) / ED DIAGNOSES   Final diagnoses:  Epigastric abdominal pain  Acute gastritis without hemorrhage, unspecified gastritis type      NEW MEDICATIONS STARTED DURING THIS VISIT:  ED Discharge Orders         Ordered    omeprazole (PRILOSEC) 40 MG capsule  Daily     04/12/19 1453    ondansetron (ZOFRAN ODT) 4 MG disintegrating tablet  Every 8 hours PRN     04/12/19 1453           Note:  This document was prepared using Dragon voice recognition software and may include unintentional dictation errors.    Nita Sickle, MD 04/12/19 [phone]

## 2019-04-12 NOTE — Discharge Instructions (Addendum)
Avoid taking NSAIDs and try Tylenol instead for your menstrual cramps.  Take 1 omeprazole a day as prescribed.  Follow-up with GI.  Return to the emergency room if your vomiting blood or coffee grounds, if your stool is black it, if your abdominal pain is worse, or if you have a fever.

## 2019-04-12 NOTE — ED Notes (Signed)
Pt given crackers and gingerale at this time 

## 2019-04-12 NOTE — ED Notes (Signed)
Patient transported to Ultrasound 

## 2019-04-12 NOTE — ED Triage Notes (Addendum)
Says abdominal pain upper stomach. Started noon yesterday.  Says she feels constipated , but she is also on iron.

## 2019-12-25 ENCOUNTER — Telehealth: Payer: Self-pay | Admitting: General Practice

## 2019-12-25 NOTE — Telephone Encounter (Signed)
Individual has been contacted 3+ times. No further attempts to reach individual will be made.

## 2020-04-24 IMAGING — US US ABDOMEN LIMITED
1 series · 14 of 25 positions shown · non-contrast
Comparison: None.

CLINICAL DATA: Upper abdominal pain

EXAM:
ULTRASOUND ABDOMEN LIMITED RIGHT UPPER QUADRANT

[Series 1: us abdomen limited · 0.17mm/px · 14 of 66 slices shown]
[im 1/66]
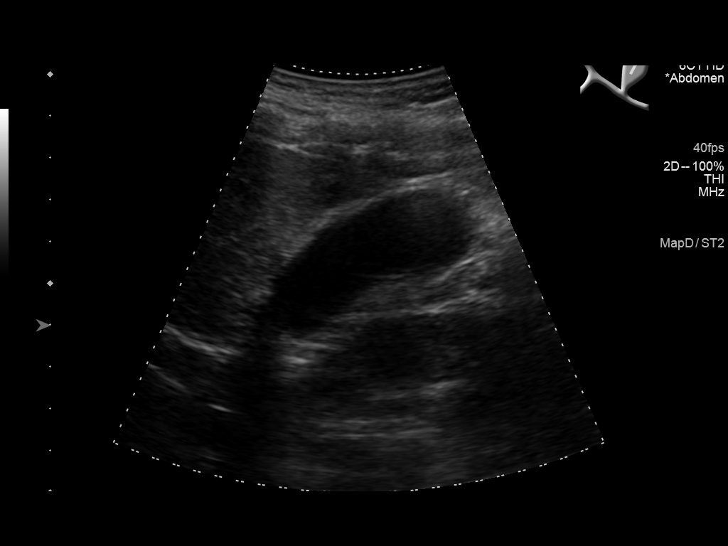
[im 6/66]
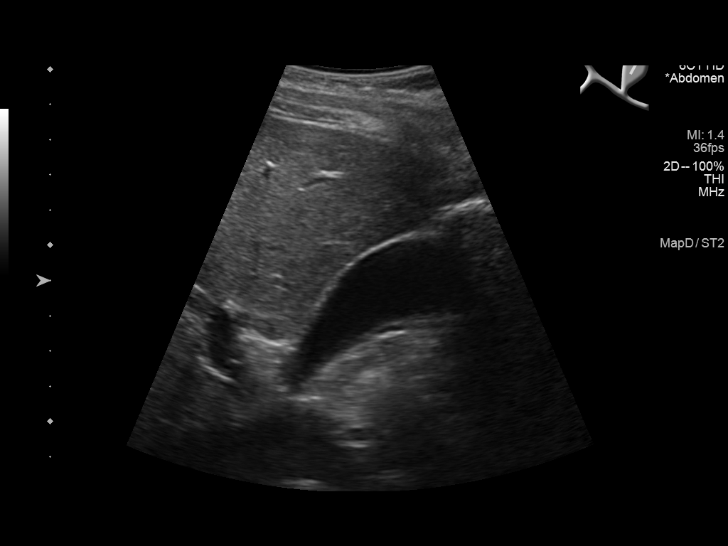
[im 11/66]
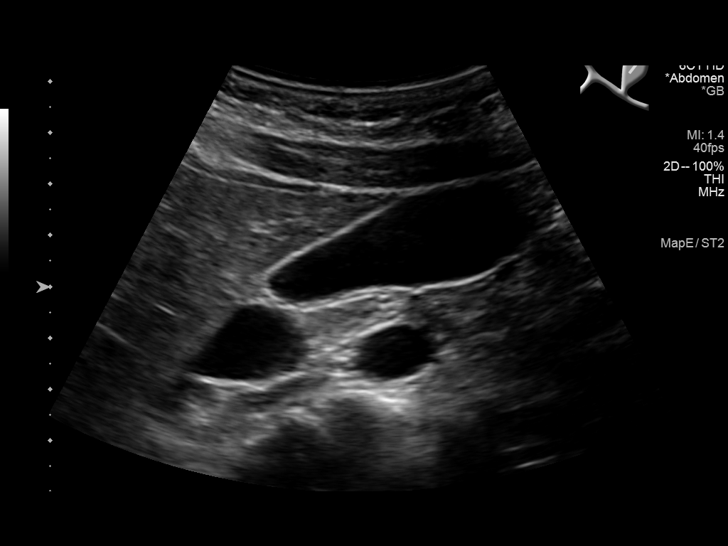
[im 17/66]
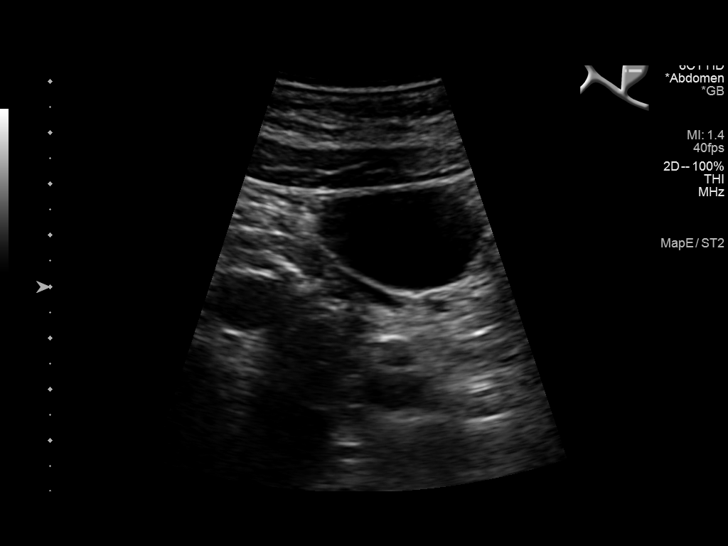
[im 22/66]
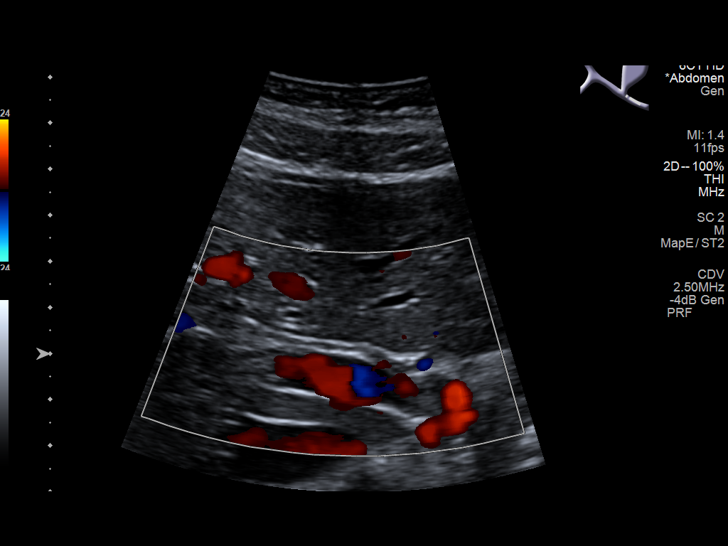
[im 25/66]
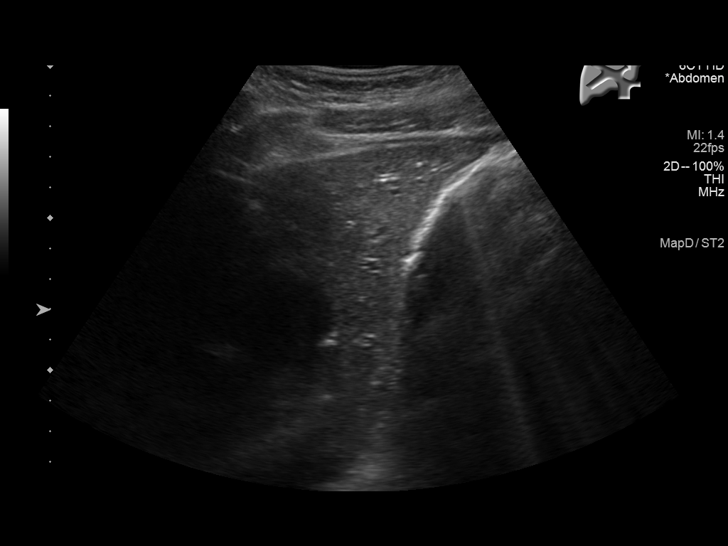
[im 30/66]
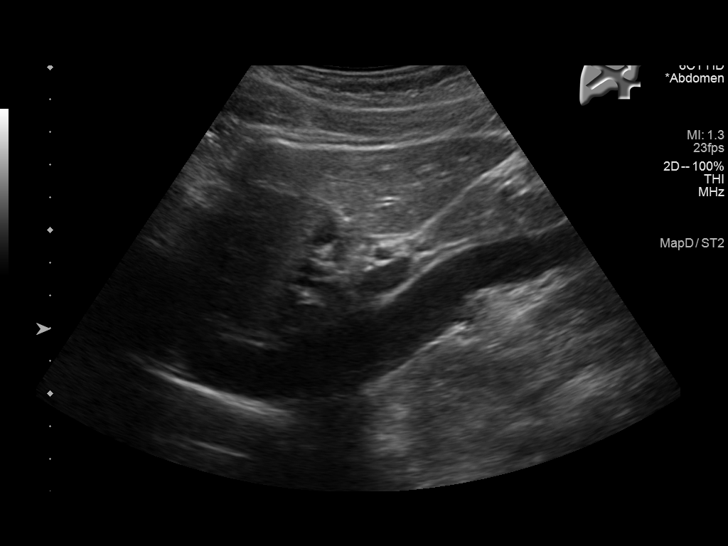
[im 36/66]
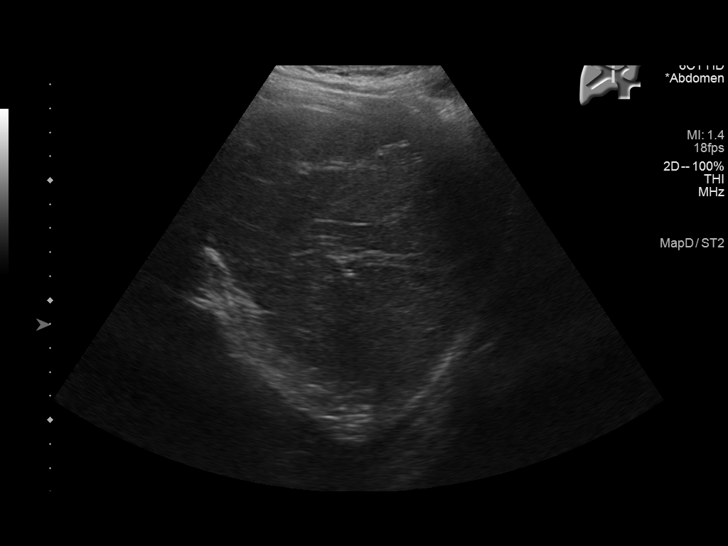
[im 41/66]
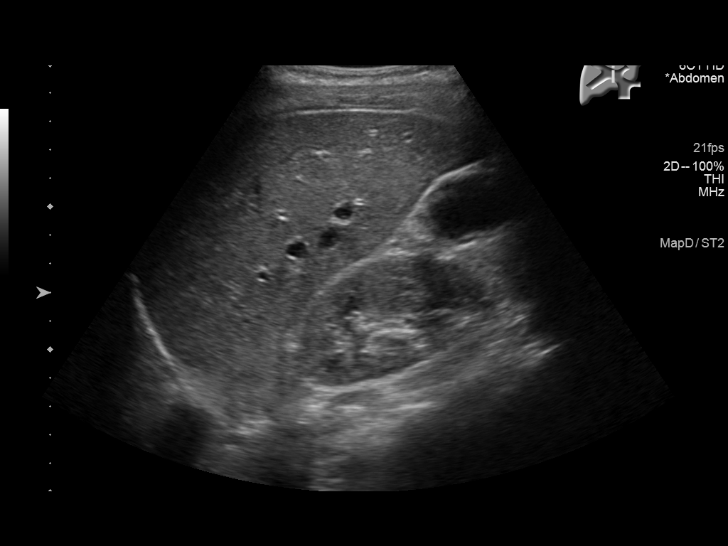
[im 44/66]
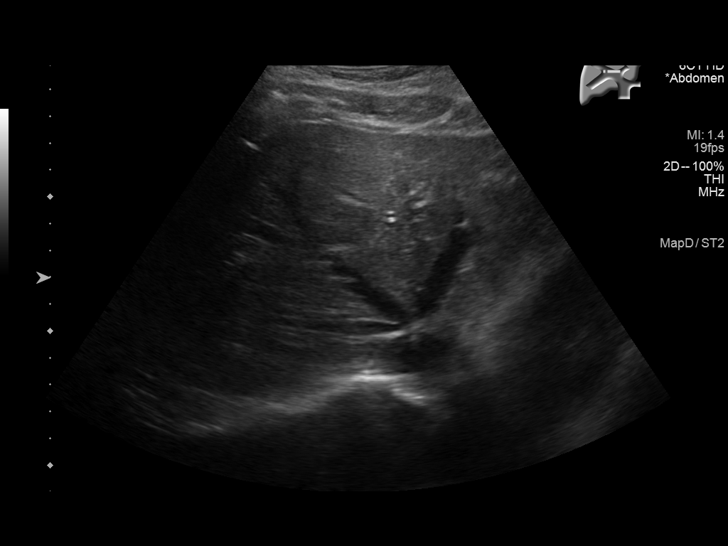
[im 49/66]
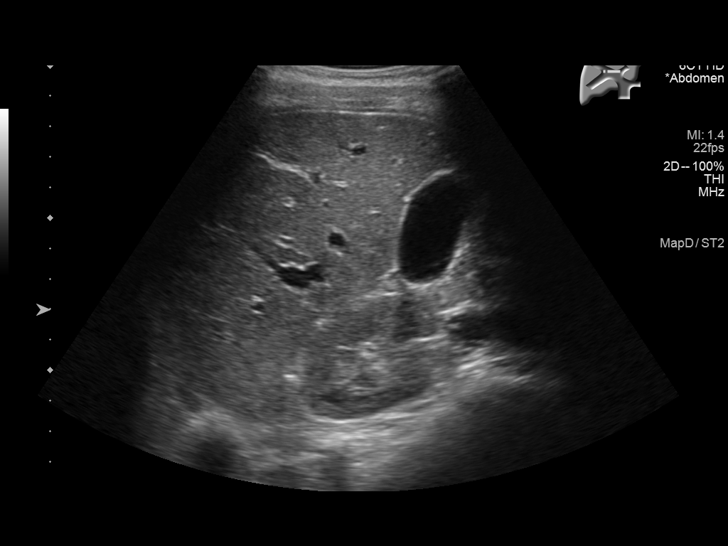
[im 55/66]
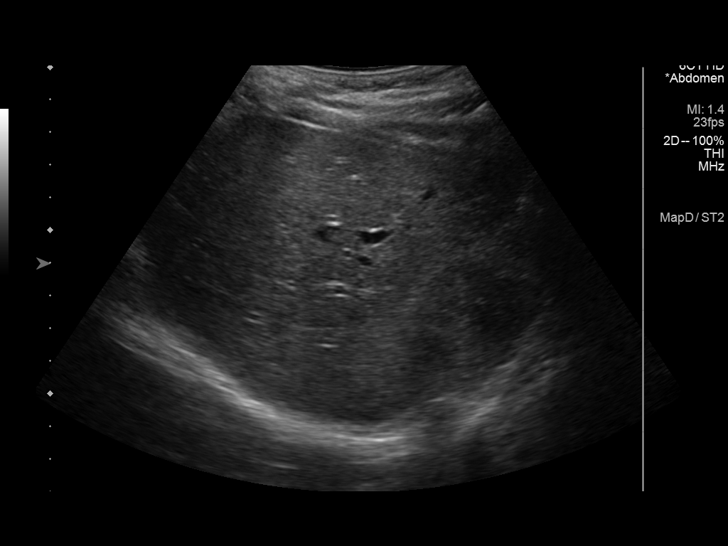
[im 60/66]
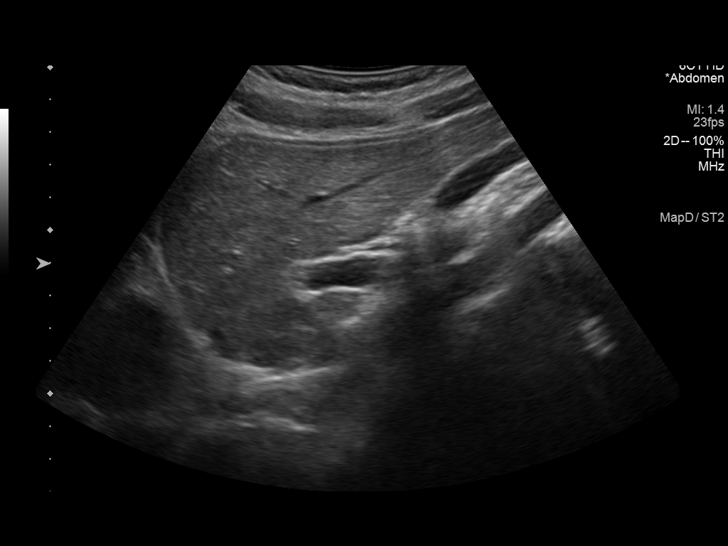
[im 66/66]
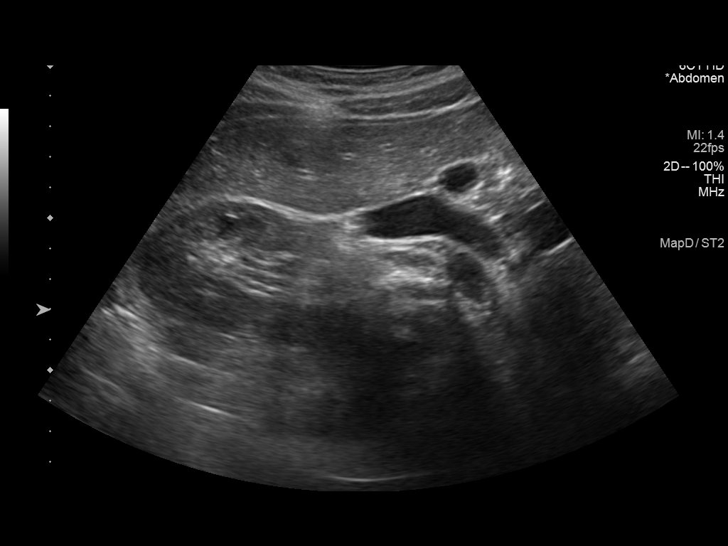

[14 of 25 positions shown; findings below may reference images not displayed]

FINDINGS: Gallbladder:

No gallstones or wall thickening visualized. There is no
pericholecystic fluid. No sonographic Murphy sign noted by
sonographer.

Common bile duct:

Diameter: 2 mm. No intrahepatic or extrahepatic biliary duct
dilatation.

Liver:

No focal lesion identified. Within normal limits in parenchymal
echogenicity. Portal vein is patent on color Doppler imaging with
normal direction of blood flow towards the liver.

Other: None.
IMPRESSION: Study within normal limits.
